# Patient Record
Sex: Female | Born: 1965 | Race: White | Hispanic: No | Marital: Single | Smoking: Former smoker
Health system: Southern US, Community
[De-identification: ages and names within clinical notes are randomized; demographics above are authoritative.]

## PROBLEM LIST (undated history)

## (undated) DIAGNOSIS — E119 Type 2 diabetes mellitus without complications: Secondary | ICD-10-CM

## (undated) HISTORY — PX: OTHER SURGICAL HISTORY: SHX169

## (undated) HISTORY — PX: ABDOMINAL HYSTERECTOMY: SHX81

## (undated) HISTORY — PX: FOOT SURGERY: SHX648

## (undated) HISTORY — PX: APPENDECTOMY: SHX54

## (undated) HISTORY — PX: CHOLECYSTECTOMY: SHX55

---

## 2007-05-01 ENCOUNTER — Ambulatory Visit: Payer: Self-pay | Admitting: Unknown Physician Specialty

## 2007-06-01 ENCOUNTER — Ambulatory Visit: Payer: Self-pay

## 2007-06-20 ENCOUNTER — Ambulatory Visit: Payer: Self-pay | Admitting: Family Medicine

## 2007-06-28 ENCOUNTER — Ambulatory Visit: Payer: Self-pay | Admitting: Urology

## 2007-08-14 ENCOUNTER — Ambulatory Visit: Payer: Self-pay | Admitting: Gastroenterology

## 2007-08-16 ENCOUNTER — Ambulatory Visit: Payer: Self-pay | Admitting: Gastroenterology

## 2007-09-10 ENCOUNTER — Ambulatory Visit: Payer: Self-pay | Admitting: General Surgery

## 2007-10-20 ENCOUNTER — Emergency Department: Payer: Self-pay | Admitting: Emergency Medicine

## 2007-11-07 ENCOUNTER — Ambulatory Visit: Payer: Self-pay | Admitting: Family Medicine

## 2007-11-08 IMAGING — US ABDOMEN ULTRASOUND
1 series · 17 of 25 positions shown · non-contrast
Comparison: none

REASON FOR EXAM: abd pain  lower quad pain  nausea and vomiting
COMMENTS:

[Series 1: abdomen ultrasound · 17 of 51 slices shown]
[im 1/51]
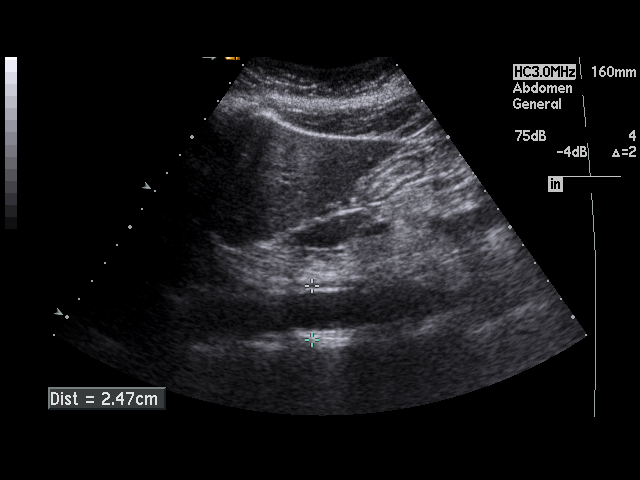
[im 5/51]
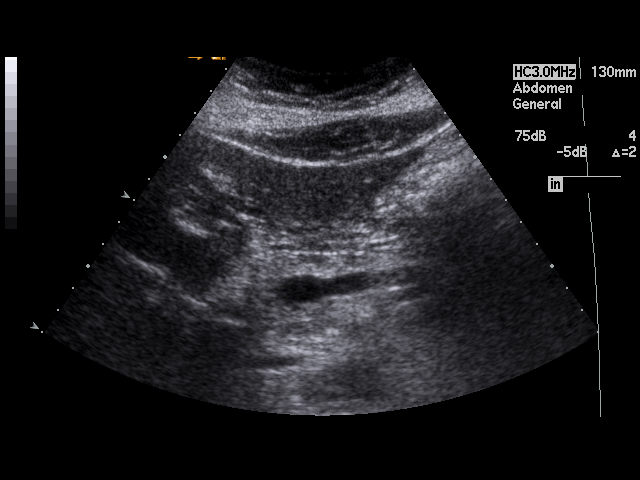
[im 7/51]
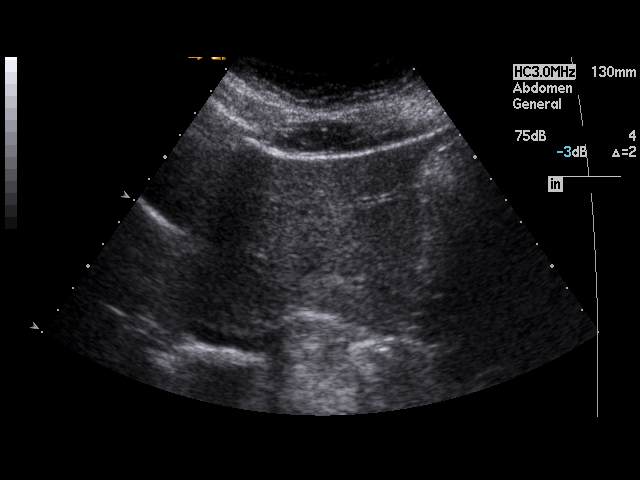
[im 11/51]
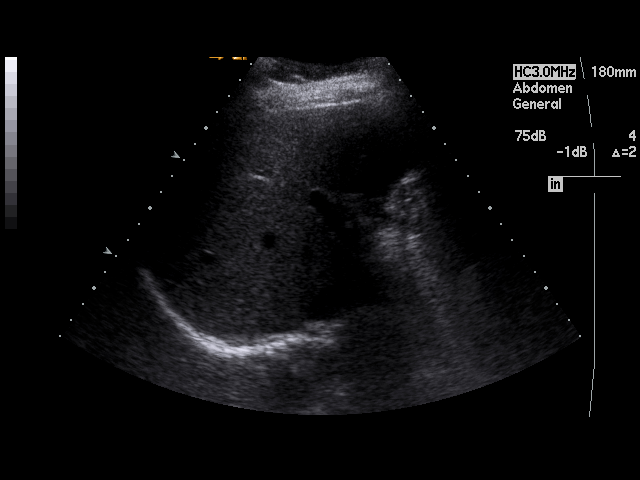
[im 13/51]
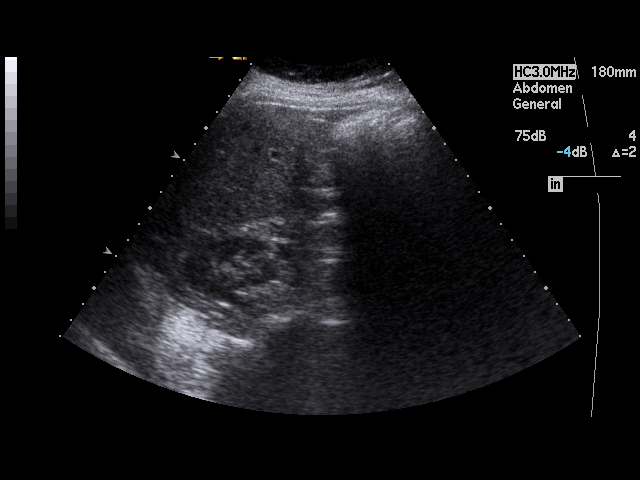
[im 17/51]
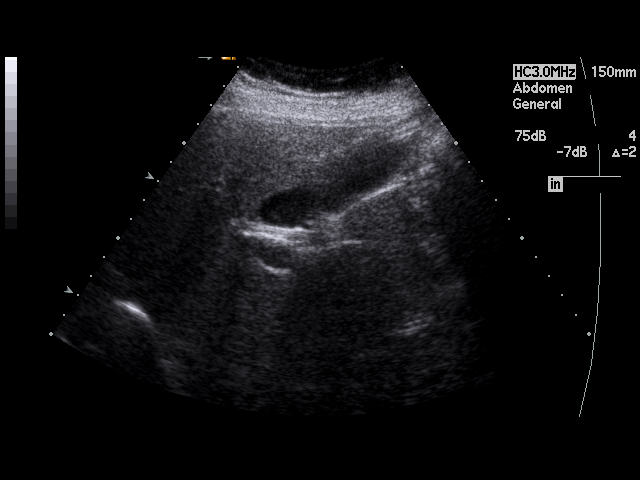
[im 19/51]
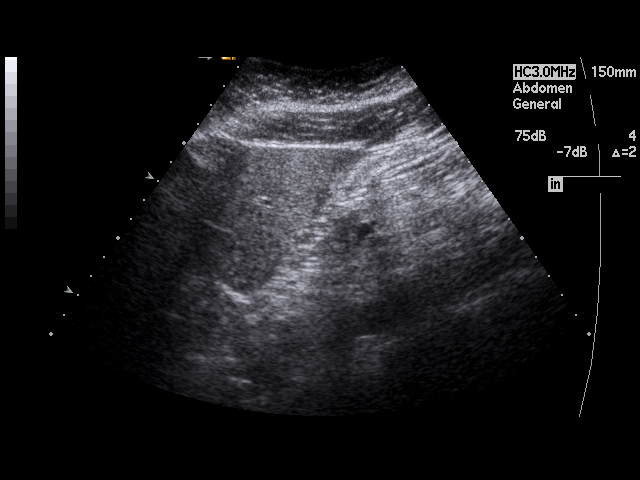
[im 23/51]
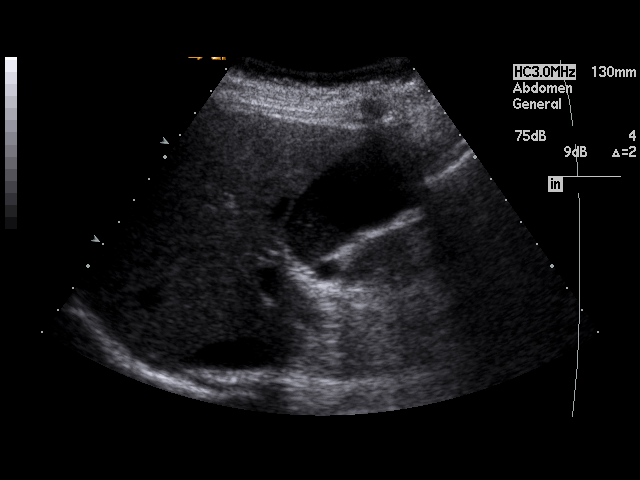
[im 26/51]
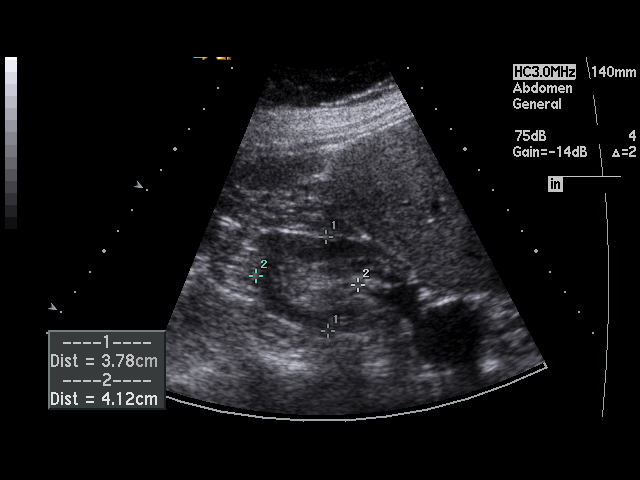
[im 28/51]
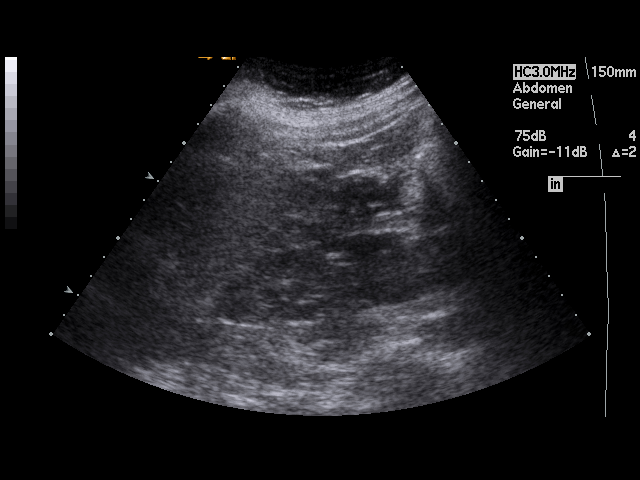
[im 32/51]
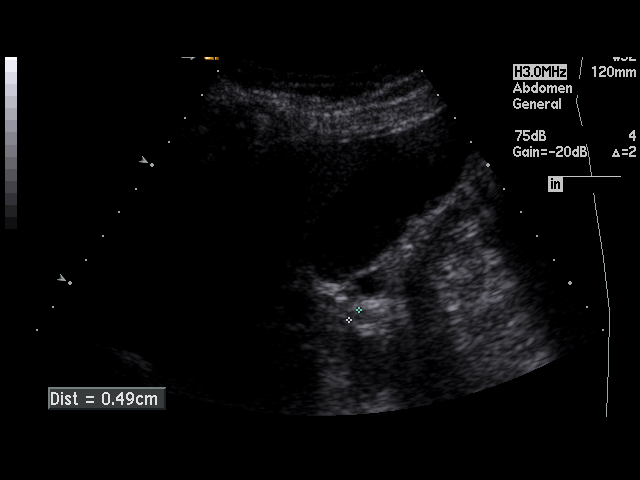
[im 34/51]
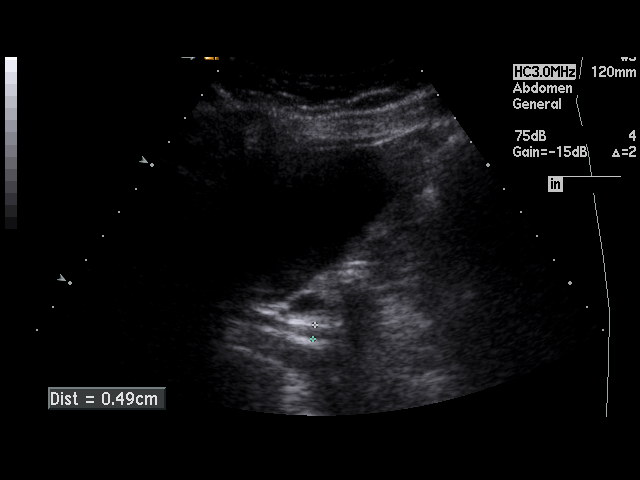
[im 38/51]
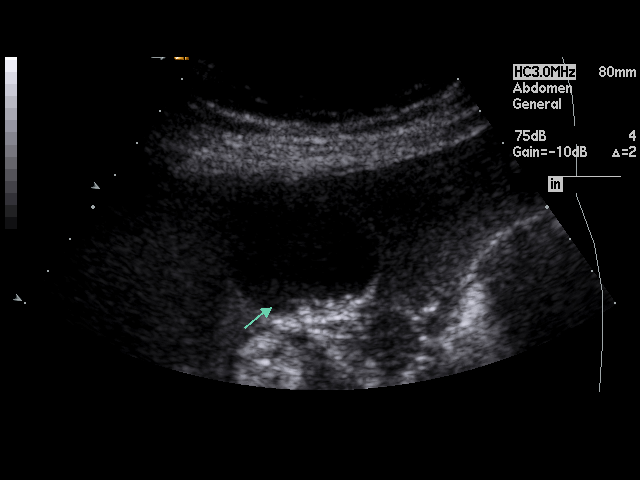
[im 40/51]
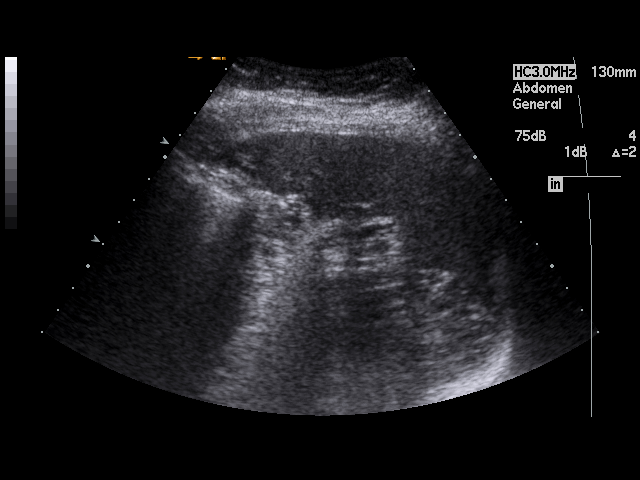
[im 44/51]
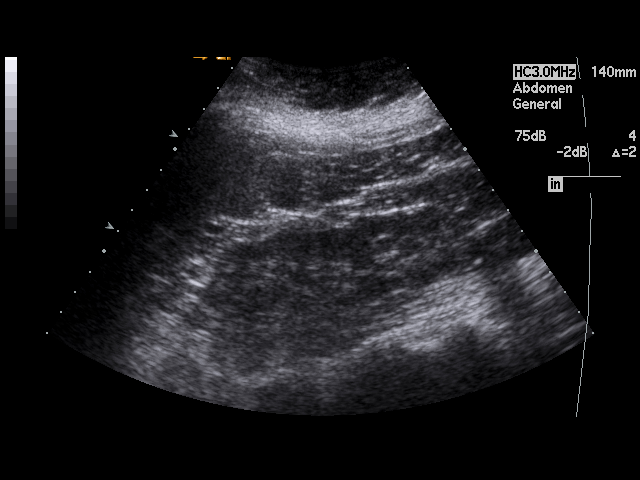
[im 46/51]
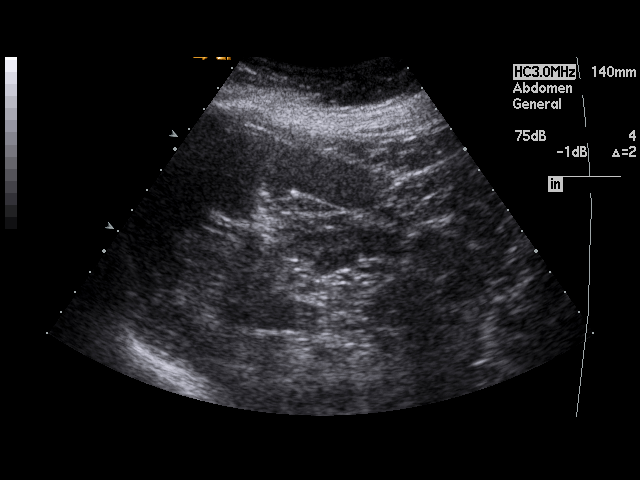
[im 51/51]
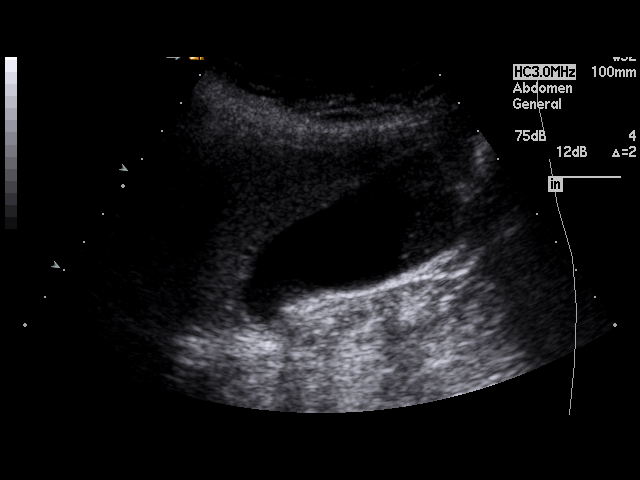

[17 of 25 positions shown; findings below may reference images not displayed]

PROCEDURE:     US  - US ABDOMEN GENERAL SURVEY  - August 16, 2007  [DATE]

RESULT:     The liver, spleen and pancreas are normal in appearance. There
is noted echogenic material in the gallbladder compatible with sludge. In
addition to the sludge, there are noted a few small shadowing echogenicities
compatible small gallstones.  In the views recorded, it is difficult to be
certain of the shadowing but prior to dictation the patient was reexamined
and definite shadowing is seen. There is no thickening of the gallbladder
wall. The common bile duct measures 4.9 mm in diameter which is within
normal limits. The kidneys show no hydronephrosis. There is no ascites.
IMPRESSION: 1. Cholelithiasis.
2. Also noted is sludge in the gallbladder.
3. No thickening of the gallbladder wall is seen.

## 2007-11-16 ENCOUNTER — Ambulatory Visit: Payer: Self-pay | Admitting: Family Medicine

## 2007-12-06 ENCOUNTER — Ambulatory Visit (HOSPITAL_COMMUNITY): Admission: RE | Admit: 2007-12-06 | Discharge: 2007-12-06 | Payer: Self-pay | Admitting: Gastroenterology

## 2007-12-12 ENCOUNTER — Ambulatory Visit: Payer: Self-pay | Admitting: Gastroenterology

## 2008-01-12 IMAGING — CT CT CHEST W/ CM
2 series · 15 of 31 positions shown, 19 images · IV contrast (APPLIED)
Comparison: none

REASON FOR EXAM: elevated d dimer: chest pain
COMMENTS:   LMP: Post Hysterectomy

[Series 4: soft tissue · axial · 0.63mm/px · z∈[+632,+676]mm · 2 of 97 slices shown]
[im 8/97  mediastinal]
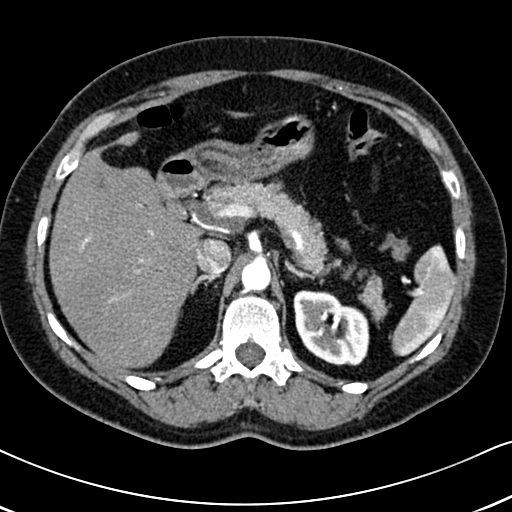
[im 23/97  mediastinal]
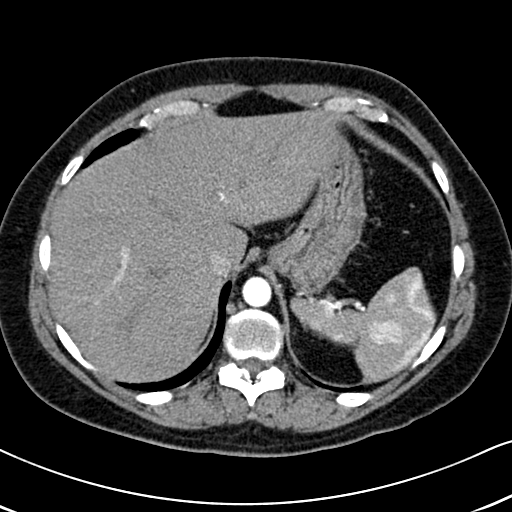

[Series 5: lung windows · axial · 0.63mm/px · z∈[+638,+874]mm · 13 of 95 slices shown, 17 images]
[im 8/95  mediastinal]
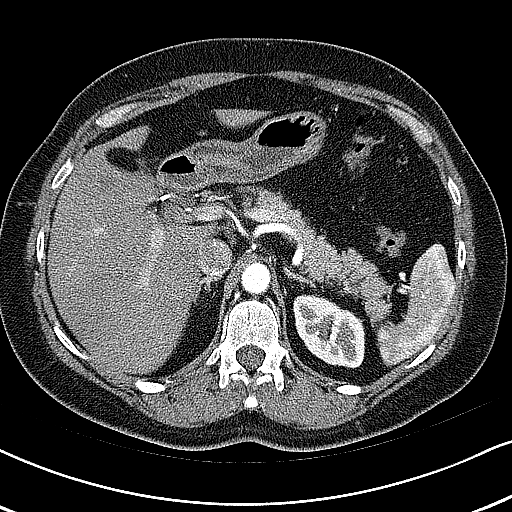
[im 8/95  lung]
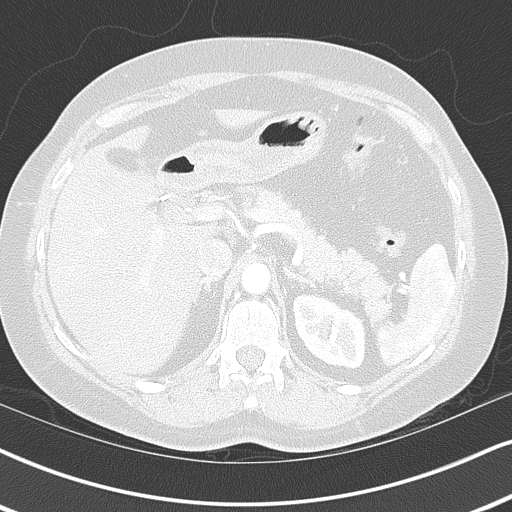
[im 15/95  lung]
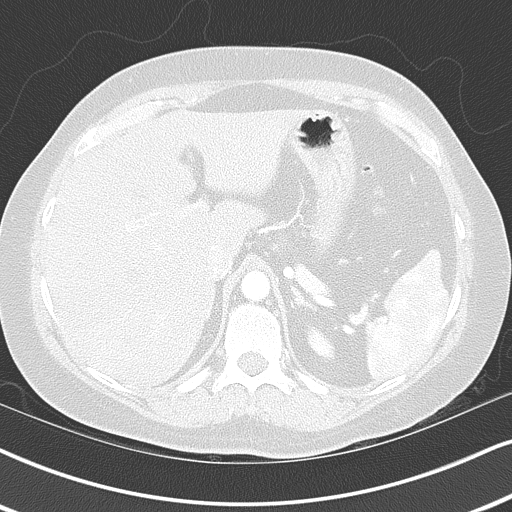
[im 22/95  lung]
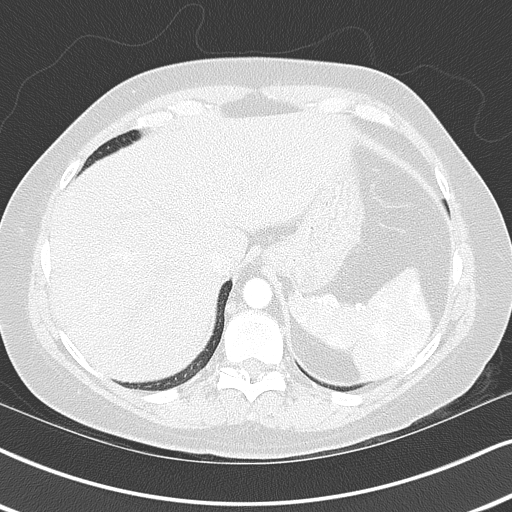
[im 29/95  lung]
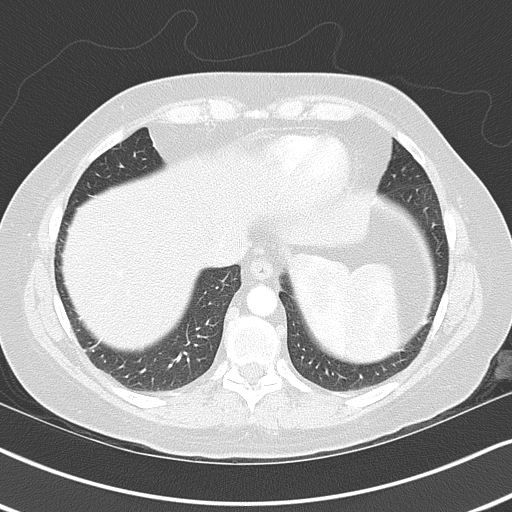
[im 37/95  mediastinal]
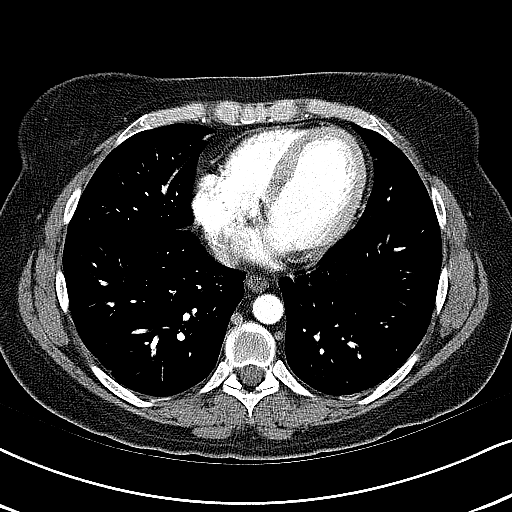
[im 37/95  lung]
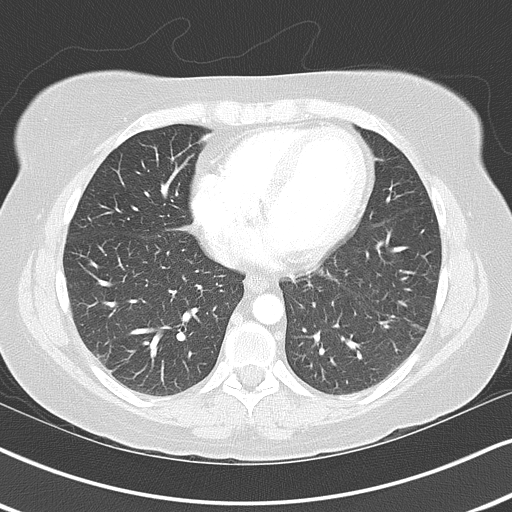
[im 44/95  lung]
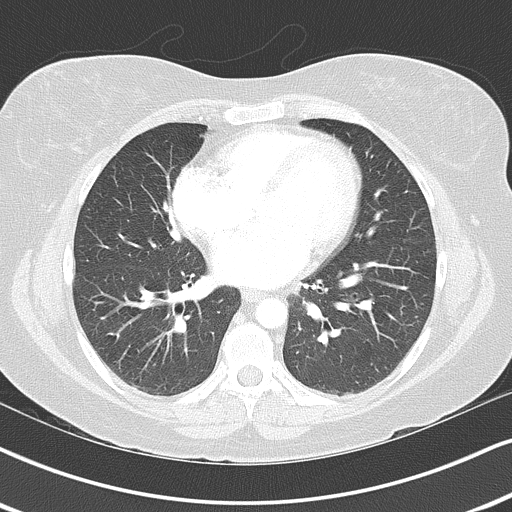
[im 48/95  lung]
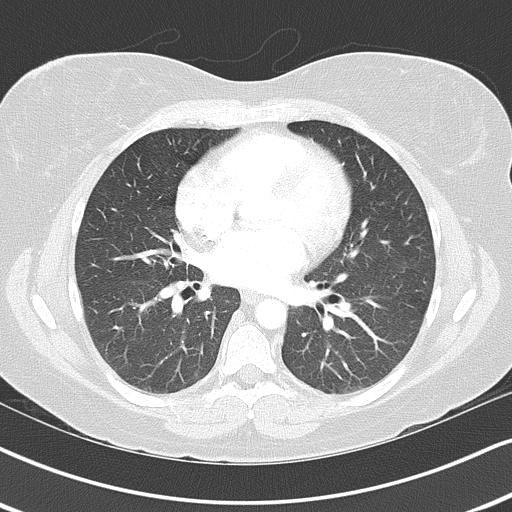
[im 51/95  lung]
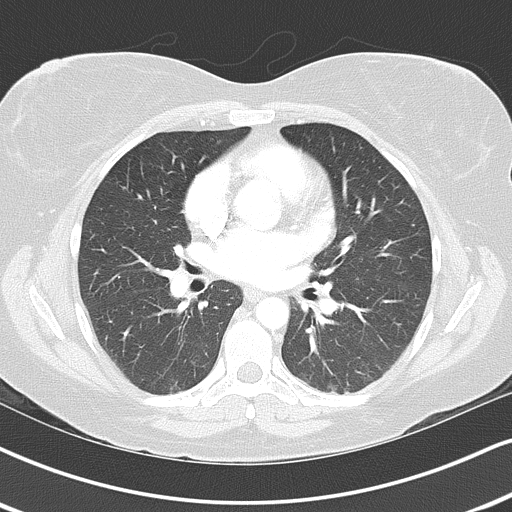
[im 58/95  mediastinal]
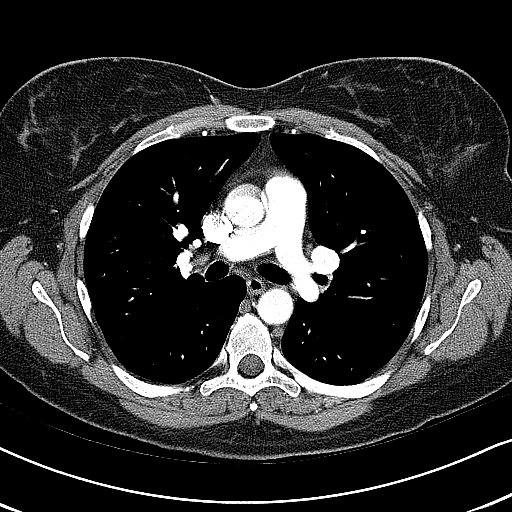
[im 58/95  lung]
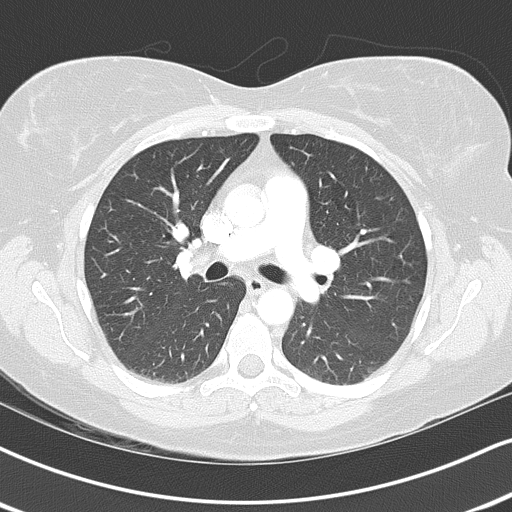
[im 66/95  lung]
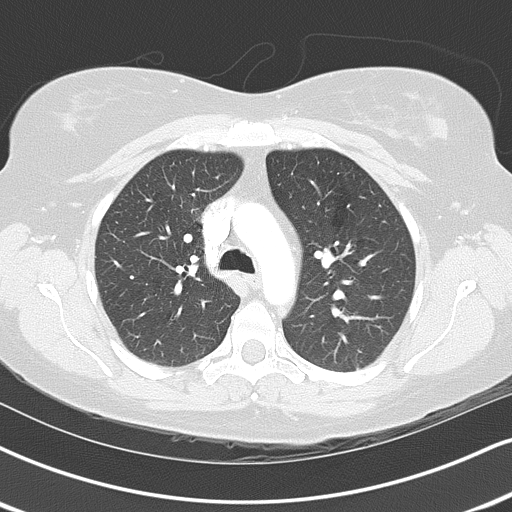
[im 73/95  lung]
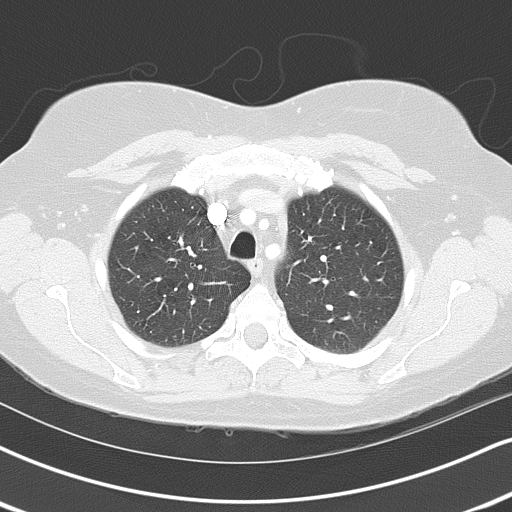
[im 80/95  lung]
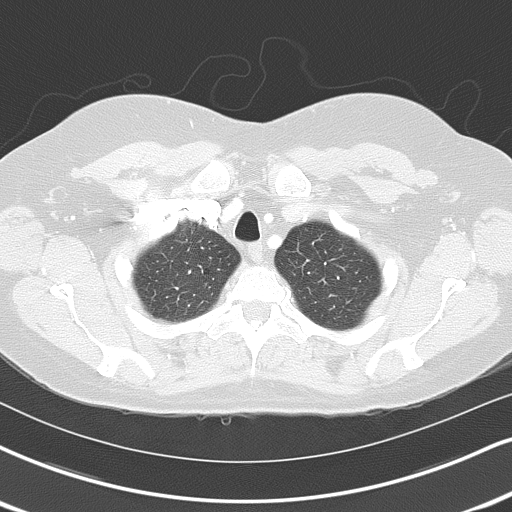
[im 87/95  mediastinal]
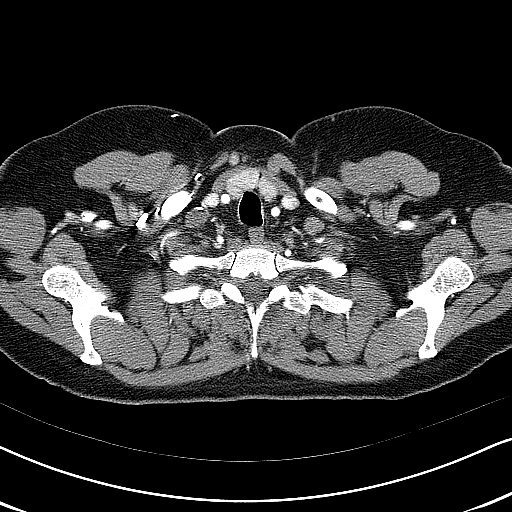
[im 87/95  lung]
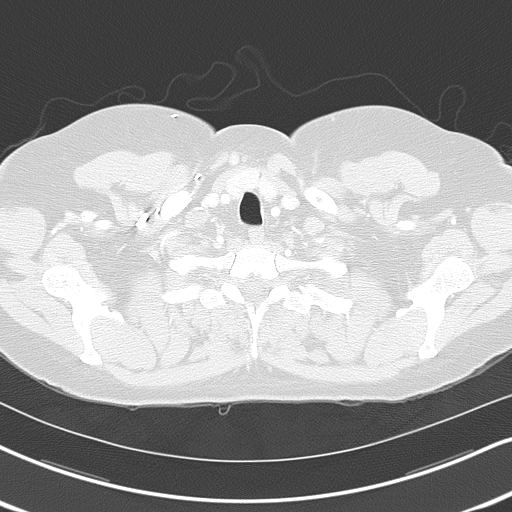

[15 of 31 positions shown; findings below may reference images not displayed]

PROCEDURE:     CT  - CT CHEST (FOR PE) W  - October 21, 2007 [DATE]

RESULT:     The patient is being evaluated for possible acute pulmonary
embolism. The patient received 100 ml of [id number].

There is mild prominence of the thyroid lobes bilaterally. They are only
partially imaged on this study. The contrast within the pulmonary arterial
tree is normal in appearance. There is no evidence of an acute pulmonary
embolism. The caliber of the thoracic aorta is normal. The cardiac chambers
are top normal in size. There is no pleural or pericardial effusion and no
pathologic sized mediastinal or hilar or axillary lymph nodes are seen. At
lung window settings no infiltrates or abnormal nodules are demonstrated.
Within the upper abdomen there is abnormal hypodensity associated with the
pancreas. This may reflect prior inflammatory change with pseudocyst
formation but the area in question is only partially included in the
field-of-view on today's study. The observed portions of the liver are
normal. The gallbladder is surgically absent. The partially imaged spleen
does not appear abnormal.
IMPRESSION: 1. I do not see evidence of an acute pulmonary embolism.
2. There is no evidence of CHF or pneumonia.
3. There is an abnormal appearance of the visualized portions of the
pancreatic head. A dedicated pancreatic CT scan is recommended.

A preliminary report was sent to the [HOSPITAL] the conclusion
of the study.

## 2008-03-12 ENCOUNTER — Ambulatory Visit: Payer: Self-pay | Admitting: Family Medicine

## 2008-03-17 ENCOUNTER — Ambulatory Visit: Payer: Self-pay | Admitting: Family Medicine

## 2008-04-16 ENCOUNTER — Ambulatory Visit: Payer: Self-pay | Admitting: Family Medicine

## 2008-05-08 ENCOUNTER — Ambulatory Visit: Payer: Self-pay | Admitting: Family Medicine

## 2008-06-30 ENCOUNTER — Ambulatory Visit: Payer: Self-pay | Admitting: Family Medicine

## 2009-08-28 ENCOUNTER — Ambulatory Visit: Payer: Self-pay | Admitting: Family Medicine

## 2009-09-03 ENCOUNTER — Ambulatory Visit: Payer: Self-pay | Admitting: Family Medicine

## 2009-09-16 ENCOUNTER — Encounter: Payer: Self-pay | Admitting: Family Medicine

## 2009-10-17 ENCOUNTER — Encounter: Payer: Self-pay | Admitting: Family Medicine

## 2010-09-14 ENCOUNTER — Encounter: Payer: Self-pay | Admitting: Podiatry

## 2010-09-16 ENCOUNTER — Encounter: Payer: Self-pay | Admitting: Podiatry

## 2010-09-30 ENCOUNTER — Ambulatory Visit: Payer: Self-pay | Admitting: Family Medicine

## 2010-10-17 ENCOUNTER — Encounter: Payer: Self-pay | Admitting: Podiatry

## 2011-04-27 ENCOUNTER — Ambulatory Visit: Payer: Self-pay | Admitting: Family Medicine

## 2011-06-25 ENCOUNTER — Emergency Department: Payer: Self-pay | Admitting: Unknown Physician Specialty

## 2011-07-25 ENCOUNTER — Other Ambulatory Visit: Payer: Self-pay | Admitting: Gastroenterology

## 2011-07-29 ENCOUNTER — Ambulatory Visit: Payer: Self-pay | Admitting: Gastroenterology

## 2011-07-31 ENCOUNTER — Other Ambulatory Visit: Payer: Self-pay | Admitting: Gastroenterology

## 2011-08-05 ENCOUNTER — Ambulatory Visit: Payer: Self-pay | Admitting: Gastroenterology

## 2011-08-09 LAB — PATHOLOGY REPORT

## 2011-11-01 ENCOUNTER — Ambulatory Visit: Payer: Self-pay | Admitting: Family Medicine

## 2012-11-27 ENCOUNTER — Ambulatory Visit: Payer: Self-pay | Admitting: Family Medicine

## 2014-03-11 ENCOUNTER — Ambulatory Visit: Payer: Self-pay | Admitting: Family Medicine

## 2014-03-14 ENCOUNTER — Ambulatory Visit: Payer: Self-pay | Admitting: Family Medicine

## 2014-08-18 ENCOUNTER — Ambulatory Visit: Payer: Self-pay | Admitting: Gastroenterology

## 2014-09-15 ENCOUNTER — Ambulatory Visit: Payer: Self-pay | Admitting: Family Medicine

## 2015-12-03 ENCOUNTER — Other Ambulatory Visit: Payer: Self-pay | Admitting: Family Medicine

## 2015-12-03 DIAGNOSIS — Z139 Encounter for screening, unspecified: Secondary | ICD-10-CM

## 2015-12-09 ENCOUNTER — Other Ambulatory Visit: Payer: Self-pay | Admitting: Family Medicine

## 2015-12-09 DIAGNOSIS — N649 Disorder of breast, unspecified: Secondary | ICD-10-CM

## 2015-12-29 ENCOUNTER — Ambulatory Visit
Admission: RE | Admit: 2015-12-29 | Discharge: 2015-12-29 | Disposition: A | Discharge status: Home / Self Care | Payer: Self-pay | Source: Ambulatory Visit | Attending: Family Medicine | Admitting: Family Medicine

## 2015-12-29 ENCOUNTER — Other Ambulatory Visit: Payer: Self-pay | Admitting: Family Medicine

## 2015-12-29 DIAGNOSIS — N649 Disorder of breast, unspecified: Secondary | ICD-10-CM

## 2016-01-10 ENCOUNTER — Encounter: Payer: Self-pay | Admitting: Emergency Medicine

## 2016-01-10 ENCOUNTER — Emergency Department
Admission: EM | Admit: 2016-01-10 | Discharge: 2016-01-10 | Disposition: A | Discharge status: Home / Self Care | Payer: Self-pay | Attending: Emergency Medicine | Admitting: Emergency Medicine

## 2016-01-10 DIAGNOSIS — K122 Cellulitis and abscess of mouth: Secondary | ICD-10-CM | POA: Insufficient documentation

## 2016-01-10 DIAGNOSIS — Z87891 Personal history of nicotine dependence: Secondary | ICD-10-CM | POA: Insufficient documentation

## 2016-01-10 DIAGNOSIS — E119 Type 2 diabetes mellitus without complications: Secondary | ICD-10-CM | POA: Insufficient documentation

## 2016-01-10 HISTORY — DX: Type 2 diabetes mellitus without complications: E11.9

## 2016-01-10 MED ORDER — IBUPROFEN 800 MG PO TABS: 800.0000 mg | ORAL_TABLET | Freq: Three times a day (TID) | ORAL | Status: AC | PRN

## 2016-01-10 MED ORDER — AMOXICILLIN 500 MG PO TABS: 500.0000 mg | ORAL_TABLET | Freq: Three times a day (TID) | ORAL | Status: AC

## 2016-01-10 NOTE — ED Notes (Signed)
Patient reports dental pain that started on Thursday or Friday when she woke up that starts on the left side of her mouth.  Patient c/o pain that starts under her chin that goes up towards her left cheek.  Patient does have a dentist but has not been there in a while.

## 2016-01-10 NOTE — ED Notes (Signed)
Pt verbalized understanding of discharge instructions. NAD at this time. 

## 2016-01-10 NOTE — ED Provider Notes (Signed)
[facility] Emergency Department Provider Note  ____________________________________________  Time seen: Approximately 3:41 PM  I have reviewed the triage vital signs and the nursing notes.   HISTORY  Chief Complaint Dental Pain    HPI Heidi Hubbard is a 50 y.o. female , NAD, presents to the emergency department with left lower tooth pain 2 days. Has noted redness and swelling about the area but has not seen any oozing or weeping. [place] [facility] [place] [place] [facility] [place]. Has had some teeth pulled due to dental caries and infections. Denies any injury or trauma to the mouth. Has had some tenderness about the left lower jawline. Has been using Orajel over-the-counter which helps with pain for short periods of time. Denies fever, chills, body aches.   Past Medical History  Diagnosis Date  . Diabetes mellitus without complication (HCC)     There are no active problems to display for this patient.   Past Surgical History  Procedure Laterality Date  . Abdominal hysterectomy    . Cholecystectomy    . Appendectomy    . Foot surgery    . Carpel tunnel release      Current Outpatient Rx  Name  Route  Sig  Dispense  Refill  . amoxicillin (AMOXIL) 500 MG tablet   Oral   Take 1 tablet (500 mg total) by mouth 3 (three) times daily with meals.   30 tablet   0   . ibuprofen (ADVIL,MOTRIN) 800 MG tablet   Oral   Take 1 tablet (800 mg total) by mouth every 8 (eight) hours as needed (pain).   60 tablet   0     Allergies Metformin and related and Other  Family History  Problem Relation Age of Onset  . Breast cancer Mother 4945  . Breast cancer Sister 2845  . Breast cancer Maternal Grandmother     Social History Social History  Substance Use Topics  . Smoking status: Former Games developermoker  . Smokeless tobacco: None  . Alcohol Use: No     Review of Systems  Constitutional: No fever/chills, fatigue Eyes: No visual changes.  ENT:  Positive left lower tooth pain and gum line swelling.  Cardiovascular: No chest pain. Respiratory: No shortness of breath. Gastrointestinal: No abdominal pain.  No nausea, vomiting. Musculoskeletal: Positive left lower jaw pain. Negative for back pain.  Skin: Negative for rash. Neurological: Negative for headaches, focal weakness or numbness. 10-point ROS otherwise negative.  ____________________________________________   PHYSICAL EXAM:  VITAL SIGNS: ED Triage Vitals  Enc Vitals Group     BP 01/10/16 1510 160/87 mmHg     Pulse Rate 01/10/16 1510 94     Resp 01/10/16 1510 18     Temp 01/10/16 1510 98.9 F (37.2 C)     Temp Source 01/10/16 1510 Oral     SpO2 01/10/16 1510 97 %     Weight 01/10/16 1510 180 lb (81.647 kg)     Height 01/10/16 1510 5\' 4"  (1.626 m)     Head Cir --      Peak Flow --      Pain Score 01/10/16 1512 5     Pain Loc --      Pain Edu? --      Excl. in GC? --     Constitutional: Alert and oriented. Well appearing and in no acute distress. Eyes: Conjunctivae are normal.  Head: Atraumatic. ENT:      Nose: No congestion/rhinnorhea.  Mouth/Throat: Erythema and swelling noted about the left lower anterior gum line with tenderness to palpation. 4 frontal teeth are present about the lower jaw, all in poor repair. Mucous membranes are moist.  Neck: Supple with full range of motion. Hematological/Lymphatic/Immunilogical: No cervical lymphadenopathy. Respiratory: Normal respiratory effort without tachypnea or retractions.  Musculoskeletal: Mild tenderness to palpation around the left lower anterior jawline. No TMJ tenderness. Neurologic:  Normal speech and language. No gross focal neurologic deficits are appreciated.  Skin:  Skin is warm, dry and intact. No rash noted. Psychiatric: Mood and affect are normal. Speech and behavior are normal. Patient exhibits appropriate insight and judgement.   ____________________________________________    LABS  None  ____________________________________________  EKG  None ____________________________________________  RADIOLOGY  None ____________________________________________    PROCEDURES  Procedure(s) performed: None    Medications - No data to display   ____________________________________________   INITIAL IMPRESSION / ASSESSMENT AND PLAN / ED COURSE  Patient's diagnosis is consistent with cellulitis and abscess of oral soft tissues. Patient will be discharged home with prescriptions for amoxicillin and ibuprofen to take as directed. May continue to use over-the-counter Orajel liquid for pain relief. Patient is to follow up with the [facility] dental clinic tomorrow for recheck. Patient is given ED precautions to return to the ED for any worsening or new symptoms.    ____________________________________________  FINAL CLINICAL IMPRESSION(S) / ED DIAGNOSES  Final diagnoses:  Cellulitis and abscess of oral soft tissues      NEW MEDICATIONS STARTED DURING THIS VISIT:  New Prescriptions   AMOXICILLIN (AMOXIL) 500 MG TABLET    Take 1 tablet (500 mg total) by mouth 3 (three) times daily with meals.   IBUPROFEN (ADVIL,MOTRIN) 800 MG TABLET    Take 1 tablet (800 mg total) by mouth every 8 (eight) hours as needed (pain).         Hope Pigeon, PA-C 01/10/16 1548  Rockne Menghini, MD 01/12/16 1552

## 2016-01-10 NOTE — Discharge Instructions (Signed)

## 2016-01-10 NOTE — ED Notes (Signed)
Pt reports left lower tooth pain X 2 days.  Does not have dentist.  Reports fever last night.  Denies drainage.

## 2016-12-07 ENCOUNTER — Ambulatory Visit
Admission: RE | Admit: 2016-12-07 | Discharge: 2016-12-07 | Disposition: A | Discharge status: Home / Self Care | Payer: Self-pay | Source: Ambulatory Visit | Attending: Oncology | Admitting: Oncology

## 2016-12-07 ENCOUNTER — Encounter: Payer: Self-pay | Admitting: *Deleted

## 2016-12-07 ENCOUNTER — Ambulatory Visit: Payer: Self-pay | Attending: Oncology | Admitting: *Deleted

## 2016-12-07 VITALS — BP 155/84 | HR 77 | Temp 97.3°F | Ht 64.17 in | Wt 175.8 lb

## 2016-12-07 DIAGNOSIS — Z0000 Encounter for general adult medical examination without abnormal findings: Secondary | ICD-10-CM

## 2016-12-07 NOTE — Patient Instructions (Signed)
HPV Test The human papillomavirus (HPV) test is used to look for high-risk types of HPV infection. HPV is a group of about 100 viruses. Many of these viruses cause growths on, in, or around the genitals. Most HPV viruses cause infections that usually go away without treatment. However, HPV types 6, 11, 16, and 18 are considered high-risk types of HPV that can increase your risk of cancer of the cervix or anus if the infection is left untreated. An HPV test identifies the DNA (genetic) strands of the HPV infection, so it is also referred to as the HPV DNA test. Although HPV is found in both males and females, the HPV test is only used to screen for increased cancer risk in females:  With an abnormal Pap test.  After treatment of an abnormal Pap test.  Between the ages of 30 and 65.  After treatment of a high-risk HPV infection. The HPV test may be done at the same time as a pelvic exam and Pap test in females over the age of 30. Both the HPV test and Pap test require a sample of cells from the cervix. How do I prepare for this test?  Do not douche or take a bath for 24-48 hours before the test or as directed by your health care provider.  Do not have sex for 24-48 hours before the test or as directed by your health care provider.  You may be asked to reschedule the test if you are menstruating.  You will be asked to urinate before the test. What do the results mean? It is your responsibility to obtain your test results. Ask the lab or department performing the test when and how you will get your results. Talk with your health care provider if you have any questions about your results. Your result will be negative or positive. Meaning of Negative Test Results  A negative HPV test result means that no HPV was found, and it is very likely that you do not have HPV. Meaning of Positive Test Results  A positive HPV test result indicates that you have HPV.  If your test result shows the presence  of any high-risk HPV strains, you may have an increased risk of developing cancer of the cervix or anus if the infection is left untreated.  If any low-risk HPV strains are found, you are not likely to have an increased risk of cancer. Discuss your test results with your health care provider. He or she will use the results to make a diagnosis and determine a treatment plan that is right for you. Talk with your health care provider to discuss your results, treatment options, and if necessary, the need for more tests. Talk with your health care provider if you have any questions about your results. This information is not intended to replace advice given to you by your health care provider. Make sure you discuss any questions you have with your health care provider. Document Released: 10/28/2004 Document Revised: 06/08/2016 Document Reviewed: 02/18/2014 Elsevier Interactive Patient Education  2017 [employer].  Gave patient hand-out, Women Staying Healthy, Active and Well from [facility], with education on breast health, pap smears, heart and colon health.  

## 2016-12-07 NOTE — Progress Notes (Signed)
Subjective:     Patient ID: Heidi Hubbard, female   DOB: 07/20/1966, 51 y.o.   MRN: [id number]  HPI   Review of Systems     Objective:   Physical Exam  Pulmonary/Chest: Right breast exhibits no inverted nipple, no mass, no nipple discharge, no skin change and no tenderness. Left breast exhibits no inverted nipple, no mass, no nipple discharge, no skin change and no tenderness. Breasts are symmetrical.  Abdominal: There is no splenomegaly or hepatomegaly.  Genitourinary: No labial fusion. There is no rash, tenderness, lesion or injury on the right labia. There is no rash, tenderness, lesion or injury on the left labia. Right adnexum displays no mass, no tenderness and no fullness. Left adnexum displays no mass, no tenderness and no fullness. No erythema, tenderness or bleeding in the vagina. No foreign body in the vagina. No signs of injury around the vagina. No vaginal discharge found.  Genitourinary Comments: History of hysterectomy for abnormal pap smear.       Assessment:     51 year [facility] White [facility] and [facility].  Clinical breast exam unremarkable.  Taught self breast awareness.  Patient states history of hysterectomy in 1998 due to abnormal pap smears.  Specimen collected for pap smear without difficulty.  Gave patient hand-out, Women Staying Healthy, Active and Well from [facility], with education on breast health, pap smears, heart and colon health.    Plan:     Screening mammogram ordered.  Specimen sent to the lab.  Will follow-up per [facility] protocol.

## 2016-12-10 LAB — PAP LB AND HPV HIGH-RISK
HPV, high-risk: NEGATIVE
PAP SMEAR COMMENT: 0

## 2016-12-12 ENCOUNTER — Encounter: Payer: Self-pay | Admitting: *Deleted

## 2016-12-12 NOTE — Progress Notes (Signed)
Letter mailed to inform patient of her normal mammogram and pap smear.  She is to follow up in one year with annual screening.  Next pap will be due in 5 years.  [facility] [place]
